# Patient Record
Sex: Female | Born: 1935 | Race: Black or African American | Hispanic: No | Marital: Married | Smoking: Never smoker
Health system: Southern US, Community
[De-identification: ages and names within clinical notes are randomized; demographics above are authoritative.]

## PROBLEM LIST (undated history)

## (undated) DIAGNOSIS — I509 Heart failure, unspecified: Secondary | ICD-10-CM

## (undated) DIAGNOSIS — E119 Type 2 diabetes mellitus without complications: Secondary | ICD-10-CM

## (undated) DIAGNOSIS — I10 Essential (primary) hypertension: Secondary | ICD-10-CM

---

## 2015-12-04 ENCOUNTER — Encounter (HOSPITAL_BASED_OUTPATIENT_CLINIC_OR_DEPARTMENT_OTHER): Payer: Self-pay | Admitting: *Deleted

## 2015-12-04 ENCOUNTER — Emergency Department (HOSPITAL_BASED_OUTPATIENT_CLINIC_OR_DEPARTMENT_OTHER): Payer: Medicare HMO

## 2015-12-04 ENCOUNTER — Emergency Department (HOSPITAL_BASED_OUTPATIENT_CLINIC_OR_DEPARTMENT_OTHER)
Admission: EM | Admit: 2015-12-04 | Discharge: 2015-12-04 | Disposition: A | Discharge status: Home / Self Care | Payer: Medicare HMO | Attending: Emergency Medicine | Admitting: Emergency Medicine

## 2015-12-04 DIAGNOSIS — E119 Type 2 diabetes mellitus without complications: Secondary | ICD-10-CM | POA: Insufficient documentation

## 2015-12-04 DIAGNOSIS — I10 Essential (primary) hypertension: Secondary | ICD-10-CM | POA: Insufficient documentation

## 2015-12-04 DIAGNOSIS — N939 Abnormal uterine and vaginal bleeding, unspecified: Secondary | ICD-10-CM | POA: Diagnosis present

## 2015-12-04 DIAGNOSIS — Z794 Long term (current) use of insulin: Secondary | ICD-10-CM | POA: Insufficient documentation

## 2015-12-04 DIAGNOSIS — N814 Uterovaginal prolapse, unspecified: Secondary | ICD-10-CM | POA: Diagnosis not present

## 2015-12-04 DIAGNOSIS — I5020 Unspecified systolic (congestive) heart failure: Secondary | ICD-10-CM | POA: Insufficient documentation

## 2015-12-04 DIAGNOSIS — Z79899 Other long term (current) drug therapy: Secondary | ICD-10-CM | POA: Insufficient documentation

## 2015-12-04 HISTORY — DX: Type 2 diabetes mellitus without complications: E11.9

## 2015-12-04 HISTORY — DX: Heart failure, unspecified: I50.9

## 2015-12-04 HISTORY — DX: Essential (primary) hypertension: I10

## 2015-12-04 LAB — CBC WITH DIFFERENTIAL/PLATELET
BASOS ABS: 0 10*3/uL (ref 0.0–0.1)
BASOS PCT: 0 %
EOS ABS: 0.2 10*3/uL (ref 0.0–0.7)
EOS PCT: 2 %
HCT: 39.1 % (ref 36.0–46.0)
Hemoglobin: 13 g/dL (ref 12.0–15.0)
LYMPHS PCT: 20 %
Lymphs Abs: 1.7 10*3/uL (ref 0.7–4.0)
MCH: 30.6 pg (ref 26.0–34.0)
MCHC: 33.2 g/dL (ref 30.0–36.0)
MCV: 92 fL (ref 78.0–100.0)
MONO ABS: 0.5 10*3/uL (ref 0.1–1.0)
Monocytes Relative: 6 %
Neutro Abs: 6.3 10*3/uL (ref 1.7–7.7)
Neutrophils Relative %: 72 %
PLATELETS: 191 10*3/uL (ref 150–400)
RBC: 4.25 MIL/uL (ref 3.87–5.11)
RDW: 13.3 % (ref 11.5–15.5)
WBC: 8.8 10*3/uL (ref 4.0–10.5)

## 2015-12-04 LAB — BASIC METABOLIC PANEL
ANION GAP: 8 (ref 5–15)
BUN: 24 mg/dL — ABNORMAL HIGH (ref 6–20)
CALCIUM: 8.9 mg/dL (ref 8.9–10.3)
CO2: 24 mmol/L (ref 22–32)
Chloride: 107 mmol/L (ref 101–111)
Creatinine, Ser: 1.22 mg/dL — ABNORMAL HIGH (ref 0.44–1.00)
GFR, EST AFRICAN AMERICAN: 48 mL/min — AB (ref >60)
GFR, EST NON AFRICAN AMERICAN: 41 mL/min — AB (ref >60)
Glucose, Bld: 244 mg/dL — ABNORMAL HIGH (ref 65–99)
POTASSIUM: 4.4 mmol/L (ref 3.5–5.1)
SODIUM: 139 mmol/L (ref 135–145)

## 2015-12-04 LAB — TROPONIN I

## 2015-12-04 LAB — BRAIN NATRIURETIC PEPTIDE: B NATRIURETIC PEPTIDE 5: 312.9 pg/mL — AB (ref 0.0–100.0)

## 2015-12-04 MED ORDER — FUROSEMIDE 10 MG/ML IJ SOLN
40.0000 mg | Freq: Once | INTRAMUSCULAR | Status: AC
  Administered 2015-12-04: 40 mg via INTRAVENOUS
  Filled 2015-12-04: qty 4

## 2015-12-04 NOTE — ED Notes (Signed)
Pt reports vaginal spotting "for a while"- states heavy bleeding started today. O2 sats 85-88 % during triage. Up to 94% with 2L O2

## 2015-12-04 NOTE — ED Notes (Signed)
Pt transported to US at this time. 

## 2015-12-04 NOTE — ED Notes (Signed)
Pt placed on auto vitals Q30.  

## 2015-12-04 NOTE — ED Provider Notes (Signed)
CSN: [id number]     Arrival date & time 12/04/15  1647 History  By signing my name below, I, Bethel BornBritney McCollum, attest that this documentation has been prepared under the direction and in the presence of Rolan BuccoMelanie Josephyne Tarter, MD. Electronically Signed: Bethel BornBritney McCollum, ED Scribe. 12/04/2015. 9:56 PM   Chief Complaint  Patient presents with  . Vaginal Bleeding   The history is provided by the patient. No language interpreter was used.   Jamie BrookingJosephine Welchel is a 81 y.o. female with history of HTN, DM, and CHF who presents to the Emergency Department complaining of vaginal bleeding with onset 2 months ago. Pt states that she has had intermittent vaginal spotting for 2 months but today the bleeding increased. The blood is bright red.  Pt denies abdominal pain, nausea, vomiting, dysuria, or difficulty urinating. Pt is not on home oxygen. She occasionally feels SOB but did not have any difficulty breathing today. Denies chest pain, cough, and congestion. She has been taking all of her medication with no recent missed doses. [facility].    Past Medical History  Diagnosis Date  . Diabetes mellitus without complication (HCC)   . Hypertension   . CHF (congestive heart failure) (HCC)    History reviewed. No pertinent past surgical history. No family history on file. Social History  Substance Use Topics  . Smoking status: Never Smoker   . Smokeless tobacco: Never Used  . Alcohol Use: No   OB History    No data available     Review of Systems  Constitutional: Negative for fever, chills, diaphoresis and fatigue.  HENT: Negative for congestion, rhinorrhea and sneezing.   Eyes: Negative.   Respiratory: Negative for cough, chest tightness and shortness of breath.   Cardiovascular: Negative for chest pain and leg swelling.  Gastrointestinal: Negative for nausea, vomiting, abdominal pain, diarrhea and blood in stool.  Genitourinary: Positive for vaginal  bleeding. Negative for dysuria, frequency, hematuria, flank pain and difficulty urinating.  Musculoskeletal: Negative for back pain and arthralgias.  Skin: Negative for rash.  Neurological: Negative for dizziness, speech difficulty, weakness, numbness and headaches.    Allergies  Review of patient's allergies indicates no known allergies.  Home Medications   Prior to Admission medications   Medication Sig Start Date End Date Taking? Authorizing Provider  amLODipine (NORVASC) 5 MG tablet Take 5 mg by mouth daily.   Yes Historical Provider, MD  carvedilol (COREG) 25 MG tablet Take 25 mg by mouth 2 (two) times daily with a meal.   Yes Historical Provider, MD  insulin detemir (LEVEMIR) 100 UNIT/ML injection Inject into the skin.   Yes Historical Provider, MD  isosorbide mononitrate (IMDUR) 60 MG 24 hr tablet Take 60 mg by mouth daily.   Yes Historical Provider, MD  losartan (COZAAR) 100 MG tablet Take 100 mg by mouth daily.   Yes Historical Provider, MD  rosuvastatin (CRESTOR) 40 MG tablet Take 40 mg by mouth daily.   Yes Historical Provider, MD  sitaGLIPtin (JANUVIA) 100 MG tablet Take 100 mg by mouth daily.   Yes Historical Provider, MD   BP 134/74 mmHg  Pulse 80  Temp(Src) 98.2 F (36.8 C)  Resp 20  Ht 5\' 3"  (1.6 m)  Wt 174 lb (78.926 kg)  BMI 30.83 kg/m2  SpO2 92% Physical Exam  Constitutional: She is oriented to person, place, and time. She appears well-developed and well-nourished. No distress.  Pt is on oxygen at 2L per  minute  HENT:  Head: Normocephalic and atraumatic.  Eyes: EOM are normal. Pupils are equal, round, and reactive to light.  Neck: Normal range of motion. Neck supple.  Cardiovascular: Normal rate, regular rhythm and normal heart sounds.   Pulmonary/Chest: Effort normal and breath sounds normal. No respiratory distress. She has no wheezes. She has no rales. She exhibits no tenderness.  Diminished breath sounds bilaterally.   Abdominal: Soft. Bowel sounds are  normal. She exhibits no distension. There is no tenderness. There is no rebound and no guarding.  Genitourinary:  Patient has uterine prolapse which is reducible. There is some spotty blood on the uterus. No tenderness or masses palpated.  Musculoskeletal: Normal range of motion. She exhibits no edema.  Lymphadenopathy:    She has no cervical adenopathy.  Neurological: She is alert and oriented to person, place, and time.  Skin: Skin is warm and dry. No rash noted.  Psychiatric: She has a normal mood and affect. Judgment normal.  Nursing note and vitals reviewed.   ED Course  Procedures (including critical care time) DIAGNOSTIC STUDIES: Oxygen Saturation is 95% on 2L, adequate normal by my interpretation.    COORDINATION OF CARE: 5:37 PM Discussed treatment plan which includes lab work and CXR with pt at bedside and pt agreed to plan.  Labs Review Labs Reviewed  BASIC METABOLIC PANEL - Abnormal; Notable for the following:    Glucose, Bld 244 (*)    BUN 24 (*)    Creatinine, Ser 1.22 (*)    GFR calc non Af Amer 41 (*)    GFR calc Af Amer 48 (*)    All other components within normal limits  BRAIN NATRIURETIC PEPTIDE - Abnormal; Notable for the following:    B Natriuretic Peptide 312.9 (*)    All other components within normal limits  CBC WITH DIFFERENTIAL/PLATELET  TROPONIN I    Imaging Review Dg Chest 2 View  12/04/2015  CLINICAL DATA:  Hypoxia.  Dyspnea.  Congestive heart failure. EXAM: CHEST  2 VIEW COMPARISON:  10/02/2008 FINDINGS: Heart size is within normal limits. Diffuse interstitial infiltrates are seen, suspicious for interstitial edema. This shows improvement compared to bilateral airspace disease seen on previous study. No evidence of pulmonary consolidation or pleural effusion. IMPRESSION: Diffuse interstitial infiltrates, suspicious for interstitial edema. Decreased bilateral airspace disease compared to prior study. Electronically Signed   By: Myles Rosenthal M.D.   On:  12/04/2015 18:24   US Transvaginal Non-ob  12/04/2015  CLINICAL DATA:  Postmenopausal bleeding. EXAM: TRANSABDOMINAL AND TRANSVAGINAL ULTRASOUND OF PELVIS TECHNIQUE: Both transabdominal and transvaginal ultrasound examinations of the pelvis were performed. Transabdominal technique was performed for global imaging of the pelvis including uterus, ovaries, adnexal regions, and pelvic cul-de-sac. It was necessary to proceed with endovaginal exam following the transabdominal exam to visualize the uterus, ovaries, and adnexa. COMPARISON:  None FINDINGS: Uterus Measurements: 7.1 x 3.5 x 5.1 cm. Diffusely heterogeneous uterine echotexture. Suspicion of small uterine fibroids, including a right-sided fundal lesion of 1.0 cm, and a left-sided fundal lesion of 1.2 cm. Endometrium Difficult to discretely identify given the underlying heterogeneous uterine echotexture. Measures on the order of 9 mm on image 50. Echogenicity with vascularity within the more anterior uterine body on image 45. Right ovary Measurements: 2.4 x 1.7 x 1.5 cm. Normal appearance/no adnexal mass. Left ovary Not visualized.  No adnexal mass seen. Other findings Trace pelvic fluid is nonspecific. IMPRESSION: 1. Abnormal appearance of the uterus, with diffuse heterogeneous uterine echotexture. Thickened endometrium, difficult  to discretely identify given underlying uterine heterogeneity. In the setting of post-menopausal bleeding, endometrial sampling is indicated to exclude carcinoma. If results are benign, sonohysterogram should be considered for focal lesion work-up. (Ref: Radiological Reasoning: Algorithmic Workup of Abnormal Vaginal Bleeding with Endovaginal Sonography and Sonohysterography. AJR 2008; 161:W96-04) Concurrent small volume uterine fibroids suspected. 2. Lack of visualization of the left ovary. 3. Trace nonspecific pelvic fluid. Electronically Signed   By: Jeronimo Greaves M.D.   On: 12/04/2015 20:10   US Pelvis Complete  12/04/2015   CLINICAL DATA:  Postmenopausal bleeding. EXAM: TRANSABDOMINAL AND TRANSVAGINAL ULTRASOUND OF PELVIS TECHNIQUE: Both transabdominal and transvaginal ultrasound examinations of the pelvis were performed. Transabdominal technique was performed for global imaging of the pelvis including uterus, ovaries, adnexal regions, and pelvic cul-de-sac. It was necessary to proceed with endovaginal exam following the transabdominal exam to visualize the uterus, ovaries, and adnexa. COMPARISON:  None FINDINGS: Uterus Measurements: 7.1 x 3.5 x 5.1 cm. Diffusely heterogeneous uterine echotexture. Suspicion of small uterine fibroids, including a right-sided fundal lesion of 1.0 cm, and a left-sided fundal lesion of 1.2 cm. Endometrium Difficult to discretely identify given the underlying heterogeneous uterine echotexture. Measures on the order of 9 mm on image 50. Echogenicity with vascularity within the more anterior uterine body on image 45. Right ovary Measurements: 2.4 x 1.7 x 1.5 cm. Normal appearance/no adnexal mass. Left ovary Not visualized.  No adnexal mass seen. Other findings Trace pelvic fluid is nonspecific. IMPRESSION: 1. Abnormal appearance of the uterus, with diffuse heterogeneous uterine echotexture. Thickened endometrium, difficult to discretely identify given underlying uterine heterogeneity. In the setting of post-menopausal bleeding, endometrial sampling is indicated to exclude carcinoma. If results are benign, sonohysterogram should be considered for focal lesion work-up. (Ref: Radiological Reasoning: Algorithmic Workup of Abnormal Vaginal Bleeding with Endovaginal Sonography and Sonohysterography. AJR 2008; 540:J81-19) Concurrent small volume uterine fibroids suspected. 2. Lack of visualization of the left ovary. 3. Trace nonspecific pelvic fluid. Electronically Signed   By: Jeronimo Greaves M.D.   On: 12/04/2015 20:10   I have personally reviewed and evaluated these images and lab results as part of my medical  decision-making.   EKG Interpretation None      MDM   Final diagnoses:  Uterine prolapse    Patient presents with vaginal bleeding. Pelvic exam shows uterine prolapse with some small spotty patches of blood. There is no active bleeding. She has no tenderness. Ultrasound shows some thickened endometrium but no masses were noted. In addition, she was noted to be hypoxic in triage. She is placed on a nasal cannula. Chest x-ray shows some mild pulmonary edema. Her BNP is mildly elevated. Her troponin is negative. She is asymptomatic without any chest pain or shortness of breath. She was given a dose of Lasix in the ED. She is now having normal oxygen saturations without supplemental oxygen. Her sats stayed above 93. She was discharged home in good condition. She was advised that she needs to be seen by a gynecologist. She is going to follow-up with her primary care physician at [facility] for referral to gynecology.  I advised her that she also needs follow-up with her cardiologist who is also at [facility]. She currently is not on any diuretics. Return precautions were given.  I personally performed the services described in this documentation, which was scribed in my presence.  The recorded information has been reviewed and considered.    Rolan Bucco, MD 12/04/15 2258

## 2015-12-04 NOTE — Discharge Instructions (Signed)
Heart Failure °Heart failure is a condition in which the heart has trouble pumping blood. This means your heart does not pump blood efficiently for your body to work well. In some cases of heart failure, fluid may back up into your lungs or you may have swelling (edema) in your lower legs. Heart failure is usually a long-term (chronic) condition. It is important for you to take good care of yourself and follow your health care provider's treatment plan. °CAUSES  °Some health conditions can cause heart failure. Those health conditions include: °· High blood pressure (hypertension). Hypertension causes the heart muscle to work harder than normal. When pressure in the blood vessels is high, the heart needs to pump (contract) with more force in order to circulate blood throughout the body. High blood pressure eventually causes the heart to become stiff and weak. °· Coronary artery disease (CAD). CAD is the buildup of cholesterol and fat (plaque) in the arteries of the heart. The blockage in the arteries deprives the heart muscle of oxygen and blood. This can cause chest pain and may lead to a heart attack. High blood pressure can also contribute to CAD. °· Heart attack (myocardial infarction). A heart attack occurs when one or more arteries in the heart become blocked. The loss of oxygen damages the muscle tissue of the heart. When this happens, part of the heart muscle dies. The injured tissue does not contract as well and weakens the heart's ability to pump blood. °· Abnormal heart valves. When the heart valves do not open and close properly, it can cause heart failure. This makes the heart muscle pump harder to keep the blood flowing. °· Heart muscle disease (cardiomyopathy or myocarditis). Heart muscle disease is damage to the heart muscle from a variety of causes. These can include drug or alcohol abuse, infections, or unknown reasons. These can increase the risk of heart failure. °· Lung disease. Lung disease  makes the heart work harder because the lungs do not work properly. This can cause a strain on the heart, leading it to fail. °· Diabetes. Diabetes increases the risk of heart failure. High blood sugar contributes to high fat (lipid) levels in the blood. Diabetes can also cause slow damage to tiny blood vessels that carry important nutrients to the heart muscle. When the heart does not get enough oxygen and food, it can cause the heart to become weak and stiff. This leads to a heart that does not contract efficiently. °· Other conditions can contribute to heart failure. These include abnormal heart rhythms, thyroid problems, and low blood counts (anemia). °Certain unhealthy behaviors can increase the risk of heart failure, including: °· Being overweight. °· Smoking or chewing tobacco. °· Eating foods high in fat and cholesterol. °· Abusing illicit drugs or alcohol. °· Lacking physical activity. °SYMPTOMS  °Heart failure symptoms may vary and can be hard to detect. Symptoms may include: °· Shortness of breath with activity, such as climbing stairs. °· Persistent cough. °· Swelling of the feet, ankles, legs, or abdomen. °· Unexplained weight gain. °· Difficulty breathing when lying flat (orthopnea). °· Waking from sleep because of the need to sit up and get more air. °· Rapid heartbeat. °· Fatigue and loss of energy. °· Feeling light-headed, dizzy, or close to fainting. °· Loss of appetite. °· Nausea. °· Increased urination during the night (nocturia). °DIAGNOSIS  °A diagnosis of heart failure is based on your history, symptoms, physical examination, and diagnostic tests. Diagnostic tests for heart failure may include: °·   Echocardiography. °· Electrocardiography. °· Chest X-ray. °· Blood tests. °· Exercise stress test. °· Cardiac angiography. °· Radionuclide scans. °TREATMENT  °Treatment is aimed at managing the symptoms of heart failure. Medicines, behavioral changes, or surgical intervention may be necessary to  treat heart failure. °· Medicines to help treat heart failure may include: °· Angiotensin-converting enzyme (ACE) inhibitors. This type of medicine blocks the effects of a blood protein called angiotensin-converting enzyme. ACE inhibitors relax (dilate) the blood vessels and help lower blood pressure. °· Angiotensin receptor blockers (ARBs). This type of medicine blocks the actions of a blood protein called angiotensin. Angiotensin receptor blockers dilate the blood vessels and help lower blood pressure. °· Water pills (diuretics). Diuretics cause the kidneys to remove salt and water from the blood. The extra fluid is removed through urination. This loss of extra fluid lowers the volume of blood the heart pumps. °· Beta blockers. These prevent the heart from beating too fast and improve heart muscle strength. °· Digitalis. This increases the force of the heartbeat. °· Healthy behavior changes include: °· Obtaining and maintaining a healthy weight. °· Stopping smoking or chewing tobacco. °· Eating heart-healthy foods. °· Limiting or avoiding alcohol. °· Stopping illicit drug use. °· Physical activity as directed by your health care provider. °· Surgical treatment for heart failure may include: °· A procedure to open blocked arteries, repair damaged heart valves, or remove damaged heart muscle tissue. °· A pacemaker to improve heart muscle function and control certain abnormal heart rhythms. °· An internal cardioverter defibrillator to treat certain serious abnormal heart rhythms. °· A left ventricular assist device (LVAD) to assist the pumping ability of the heart. °HOME CARE INSTRUCTIONS  °· Take medicines only as directed by your health care provider. Medicines are important in reducing the workload of your heart, slowing the progression of heart failure, and improving your symptoms. °· Do not stop taking your medicine unless directed by your health care provider. °· Do not skip any dose of medicine. °· Refill your  prescriptions before you run out of medicine. Your medicines are needed every day. °· Engage in moderate physical activity if directed by your health care provider. Moderate physical activity can benefit some people. The elderly and people with severe heart failure should consult with a health care provider for physical activity recommendations. °· Eat heart-healthy foods. Food choices should be free of trans fat and low in saturated fat, cholesterol, and salt (sodium). Healthy choices include fresh or frozen fruits and vegetables, fish, lean meats, legumes, fat-free or low-fat dairy products, and whole grain or high fiber foods. Talk to a dietitian to learn more about heart-healthy foods. °· Limit sodium if directed by your health care provider. Sodium restriction may reduce symptoms of heart failure in some people. Talk to a dietitian to learn more about heart-healthy seasonings. °· Use healthy cooking methods. Healthy cooking methods include roasting, grilling, broiling, baking, poaching, steaming, or stir-frying. Talk to a dietitian to learn more about healthy cooking methods. °· Limit fluids if directed by your health care provider. Fluid restriction may reduce symptoms of heart failure in some people. °· Weigh yourself every day. Daily weights are important in the early recognition of excess fluid. You should weigh yourself every morning after you urinate and before you eat breakfast. Wear the same amount of clothing each time you weigh yourself. Record your daily weight. Provide your health care provider with your weight record. °· Monitor and record your blood pressure if directed by your health care   provider.  Check your pulse if directed by your health care provider.  Lose weight if directed by your health care provider. Weight loss may reduce symptoms of heart failure in some people.  Stop smoking or chewing tobacco. Nicotine makes your heart work harder by causing your blood vessels to constrict.  Do not use nicotine gum or patches before talking to your health care provider.  Keep all follow-up visits as directed by your health care provider. This is important.  Limit alcohol intake to no more than 1 drink per day for nonpregnant women and 2 drinks per day for men. One drink equals 12 ounces of beer, 5 ounces of wine, or 1 ounces of hard liquor. Drinking more than that is harmful to your heart. Tell your health care provider if you drink alcohol several times a week. Talk with your health care provider about whether alcohol is safe for you. If your heart has already been damaged by alcohol or you have severe heart failure, drinking alcohol should be stopped completely.  Stop illicit drug use.  Stay up-to-date with immunizations. It is especially important to prevent respiratory infections through current pneumococcal and influenza immunizations.  Manage other health conditions such as hypertension, diabetes, thyroid disease, or abnormal heart rhythms as directed by your health care provider.  Learn to manage stress.  Plan rest periods when fatigued.  Learn strategies to manage high temperatures. If the weather is extremely hot:  Avoid vigorous physical activity.  Use air conditioning or fans or seek a cooler location.  Avoid caffeine and alcohol.  Wear loose-fitting, lightweight, and light-colored clothing.  Learn strategies to manage cold temperatures. If the weather is extremely cold:  Avoid vigorous physical activity.  Layer clothes.  Wear mittens or gloves, a hat, and a scarf when going outside.  Avoid alcohol.  Obtain ongoing education and support as needed.  Participate in or seek rehabilitation as needed to maintain or improve independence and quality of life. SEEK MEDICAL CARE IF:   You have a rapid weight gain.  You have increasing shortness of breath that is unusual for you.  You are unable to participate in your usual physical activities.  You tire  easily.  You cough more than normal, especially with physical activity.  You have any or more swelling in areas such as your hands, feet, ankles, or abdomen.  You are unable to sleep because it is hard to breathe.  You feel like your heart is beating fast (palpitations).  You become dizzy or light-headed upon standing up. SEEK IMMEDIATE MEDICAL CARE IF:   You have difficulty breathing.  There is a change in mental status such as decreased alertness or difficulty with concentration.  You have a pain or discomfort in your chest.  You have an episode of fainting (syncope). MAKE SURE YOU:   Understand these instructions.  Will watch your condition.  Will get help right away if you are not doing well or get worse.   This information is not intended to replace advice given to you by your health care provider. Make sure you discuss any questions you have with your health care provider.   Document Released: 09/18/2005 Document Revised: 02/02/2015 Document Reviewed: 10/18/2012 Elsevier Interactive Patient Education 2016 [employer]  Pelvic Organ Prolapse Pelvic organ prolapse is the stretching, bulging, or dropping of pelvic organs into an abnormal position. It happens when the muscles and tissues that surround and support pelvic structures are stretched or weak. Pelvic organ prolapse can involve:  Vagina (vaginal prolapse).  Uterus (uterine prolapse).  Bladder (cystocele).  Rectum (rectocele).  Intestines (enterocele). When organs other than the vagina are involved, they often bulge into the vagina or protrude from the vagina, depending on how severe the prolapse is. CAUSES Causes of this condition include:  Pregnancy, labor, and childbirth.  Long-lasting (chronic) cough.  Chronic constipation.  Obesity.  Past pelvic surgery.  Aging. During and after menopause, a decreased production of the hormone estrogen can weaken pelvic ligaments and muscles.  Consistently  lifting more than 50 lb (23 kg).  Buildup of fluid in the abdomen due to certain diseases and other conditions. SYMPTOMS Symptoms of this condition include:  Loss of bladder control when you cough, sneeze, strain, and exercise (stress incontinence). This may be worse immediately following childbirth, and it may gradually improve over time.  Feeling pressure in your pelvis or vagina. This pressure may increase when you cough or when you are having a bowel movement.  A bulge that protrudes from the opening of your vagina or against your vaginal wall. If your uterus protrudes through the opening of your vagina and rubs against your clothing, you may also experience soreness, ulcers, infection, pain, and bleeding.  Increased effort to have a bowel movement or urinate.  Pain in your low back.  Pain, discomfort, or disinterest in sexual intercourse.  Repeated bladder infections (urinary tract infections).  Difficulty inserting or inability to insert a tampon or applicator. In some people, this condition does not cause any symptoms. DIAGNOSIS Your health care provider may perform an internal and external vaginal and rectal exam. During the exam, you may be asked to cough and strain while you are lying down, sitting, and standing up. Your health care provider will determine if other tests are required, such as bladder function tests. TREATMENT In most cases, this condition needs to be treated only if it produces symptoms. No treatment is guaranteed to correct the prolapse or relieve the symptoms completely. Treatment may include:  Lifestyle changes, such as:  Avoiding drinking beverages that contain caffeine.  Increasing your intake of high-fiber foods. This can help to decrease constipation and straining during bowel movements.  Emptying your bladder at scheduled times (bladder training therapy). This can help to reduce or avoid urinary incontinence.  Losing weight if you are overweight  or obese.  Estrogen. Estrogen may help mild prolapse by increasing the strength and tone of pelvic floor muscles.  Kegel exercises. These may help mild cases of prolapse by strengthening and tightening the muscles of the pelvic floor.  Pessary insertion. A pessary is a soft, flexible device that is placed into your vagina by your health care provider to help support the vaginal walls and keep pelvic organs in place.  Surgery. This is often the only form of treatment for severe prolapse. Different types of surgeries are available. HOME CARE INSTRUCTIONS  Wear a sanitary pad or absorbent product if you have urinary incontinence.  Avoid heavy lifting and straining with exercise and work. Do not hold your breath when you perform mild to moderate lifting and exercise activities. Limit your activities as directed by your health care provider.  Take medicines only as directed by your health care provider.  Perform Kegel exercises as directed by your health care provider.  If you have a pessary, take care of it as directed by your health care provider. SEEK MEDICAL CARE IF:  Your symptoms interfere with your daily activities or sex life.  You need medicine to help  with the discomfort.  You notice bleeding from the vagina that is not related to your period.  You have a fever.  You have pain or bleeding when you urinate.  You have bleeding when you have a bowel movement.  You lose urine when you have sex.  You have chronic constipation.  You have a pessary that falls out.  You have vaginal discharge that has a bad smell.  You have low abdominal pain or cramping that is unusual for you.   This information is not intended to replace advice given to you by your health care provider. Make sure you discuss any questions you have with your health care provider.   Document Released: 04/15/2014 Document Reviewed: 04/15/2014 Elsevier Interactive Patient Education [employer]2016 [employer]

## 2015-12-04 NOTE — ED Notes (Signed)
Pt with sats 95% with 2L of oxygen. Pt doesn't normally wear oxygen at home.

## 2015-12-04 NOTE — ED Notes (Signed)
Patient transported to Ultrasound 

## 2016-08-29 IMAGING — US US TRANSVAGINAL NON-OB
1 series · 13 of 25 positions shown · non-contrast
Comparison: None

CLINICAL DATA: Postmenopausal bleeding.

EXAM:
TRANSABDOMINAL AND TRANSVAGINAL ULTRASOUND OF PELVIS
TECHNIQUE: Both transabdominal and transvaginal ultrasound examinations of the
pelvis were performed. Transabdominal technique was performed for
global imaging of the pelvis including uterus, ovaries, adnexal
regions, and pelvic cul-de-sac. It was necessary to proceed with
endovaginal exam following the transabdominal exam to visualize the
uterus, ovaries, and adnexa.

[Series 1: us transvaginal non-ob · 0.31mm/px · 13 of 93 slices shown]
[im 1/93]
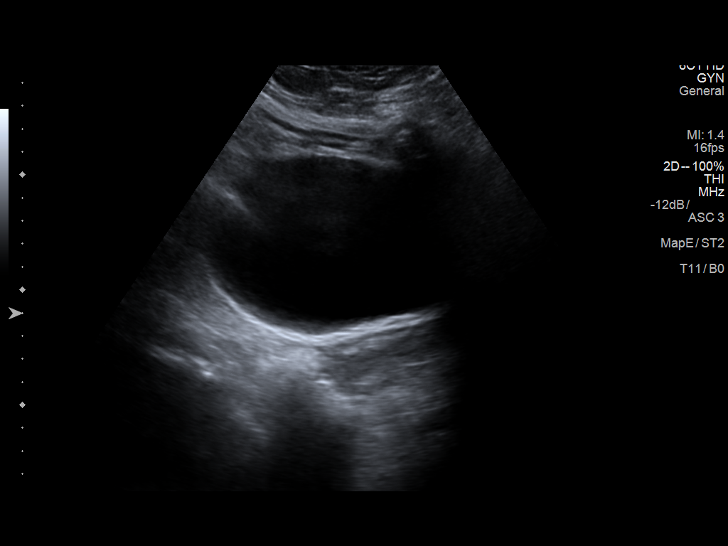
[im 8/93]
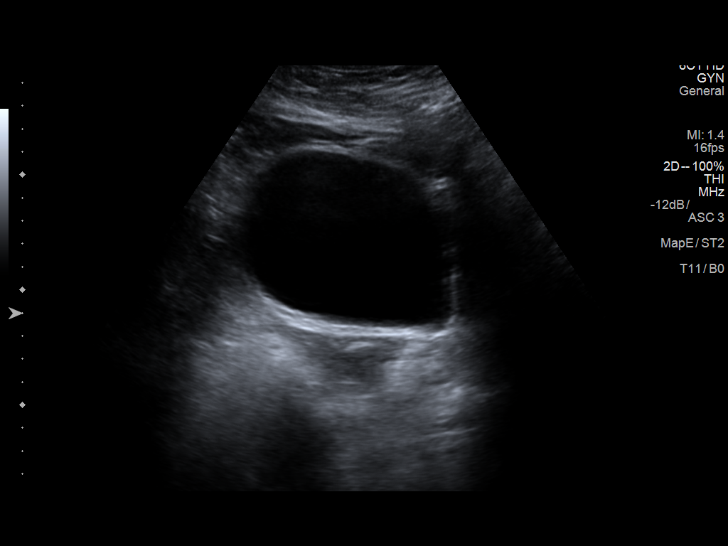
[im 16/93]
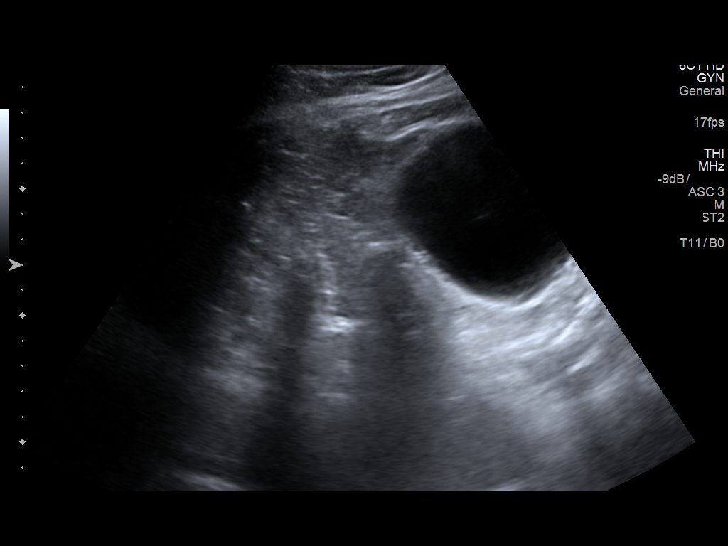
[im 24/93]
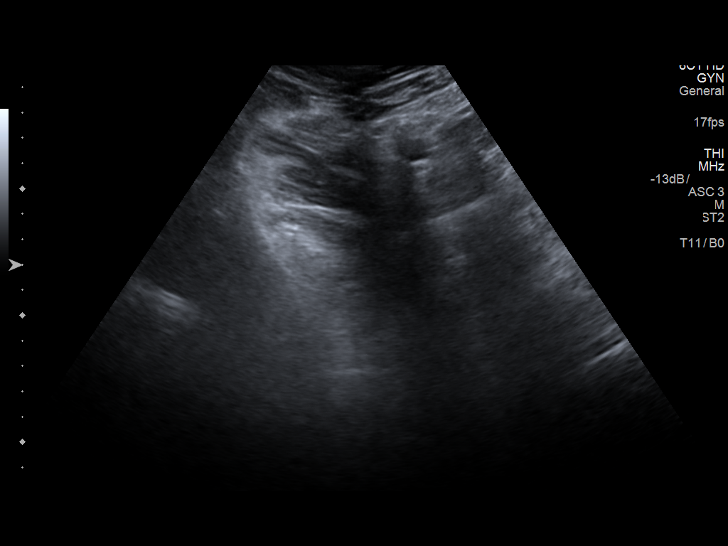
[im 31/93]
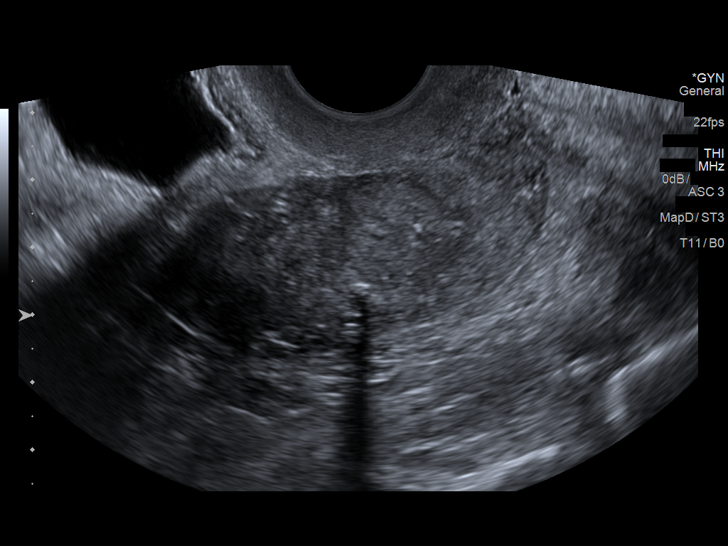
[im 39/93]
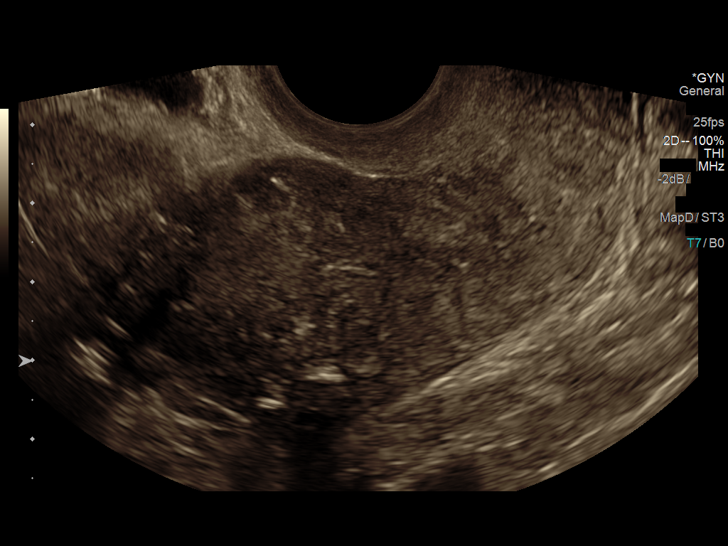
[im 47/93]
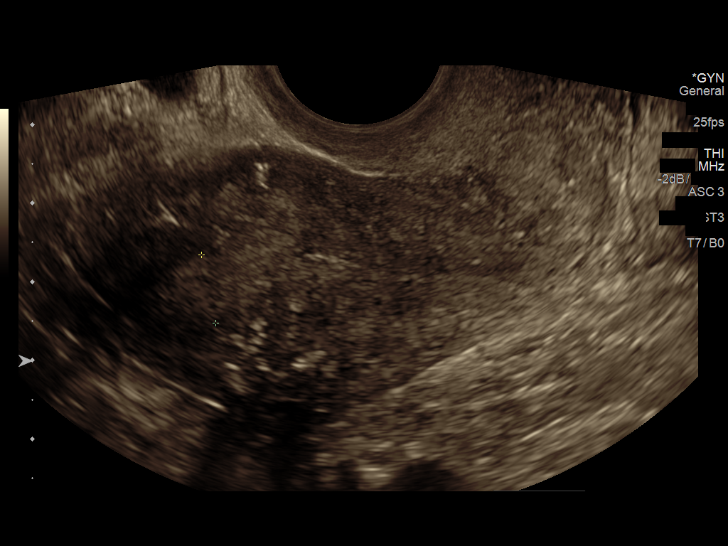
[im 54/93]
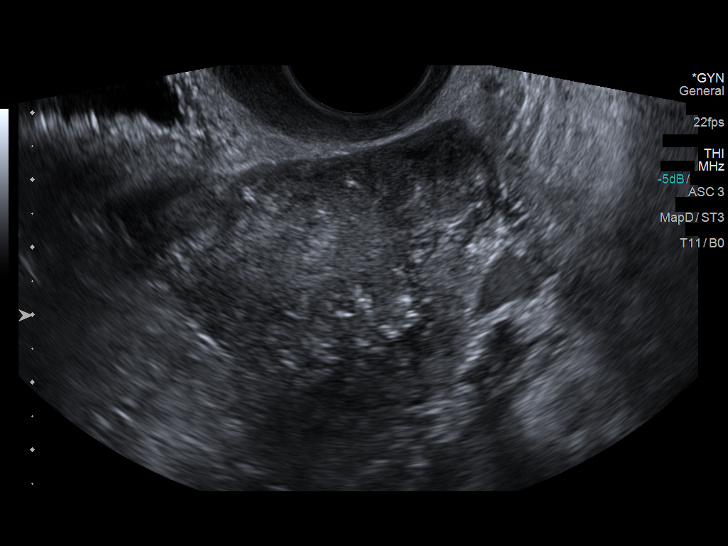
[im 62/93]
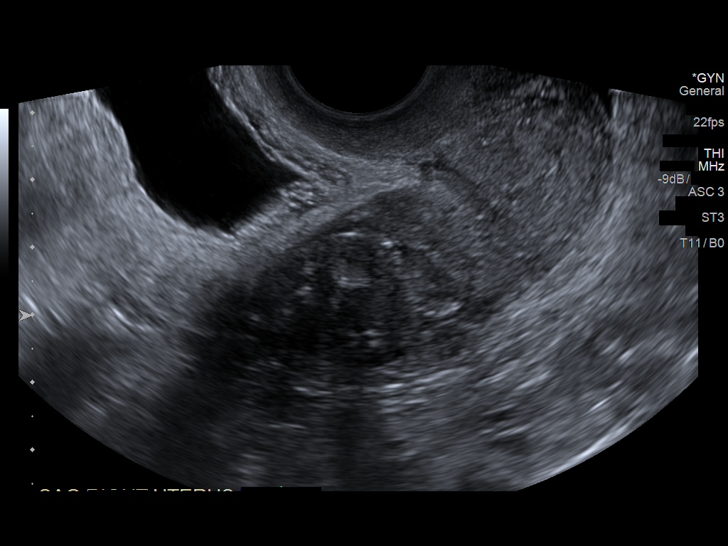
[im 70/93]
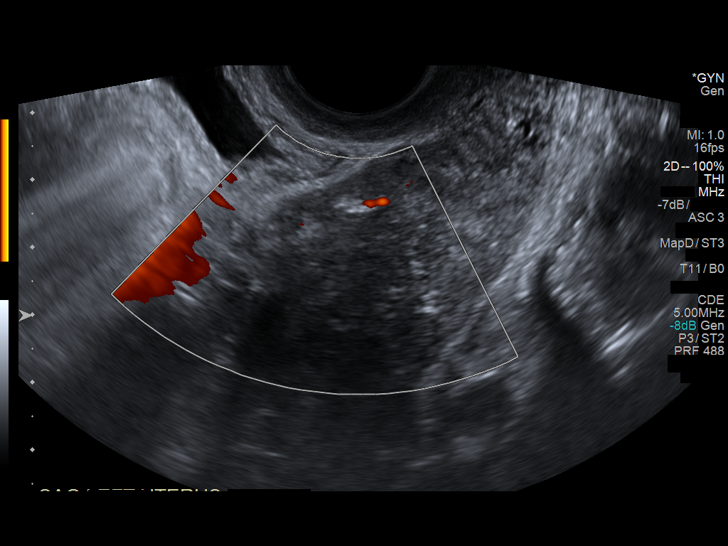
[im 77/93]
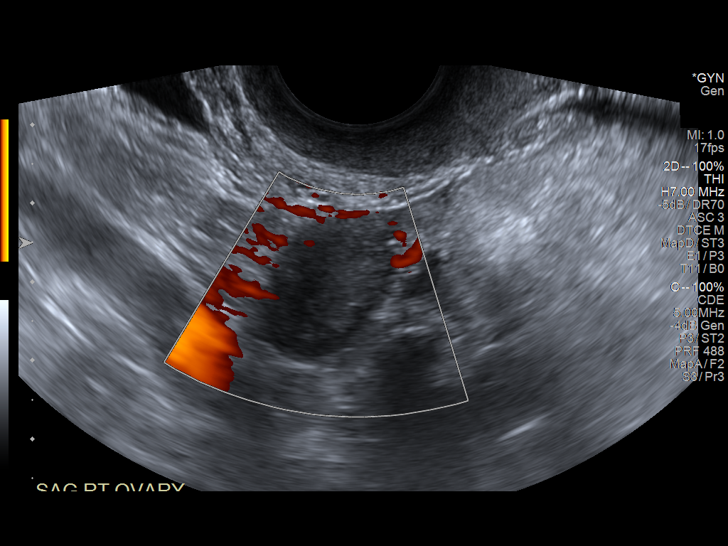
[im 85/93]
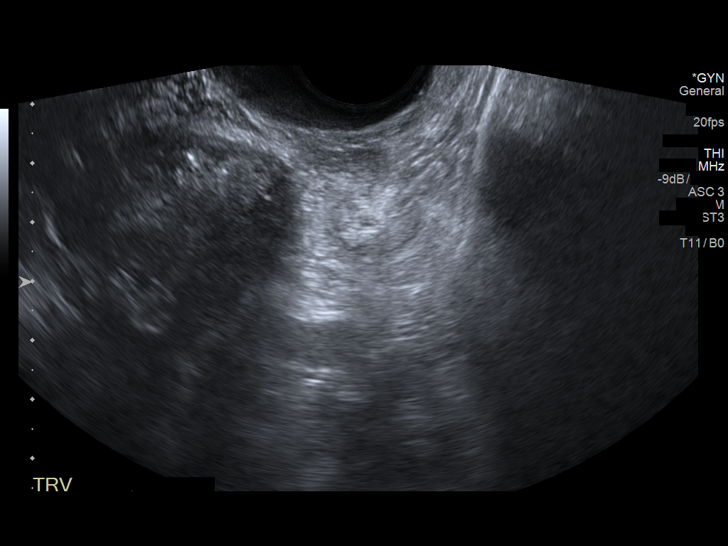
[im 93/93]
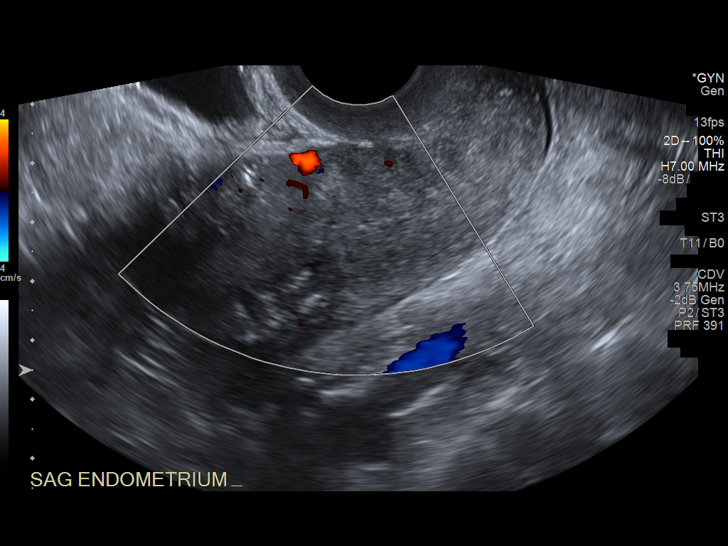

[13 of 25 positions shown; findings below may reference images not displayed]

FINDINGS: Uterus

Measurements: 7.1 x 3.5 x 5.1 cm.. Diffusely heterogeneous uterine
echotexture. Suspicion of small uterine fibroids, including a
right-sided fundal lesion of 1.0 cm, and a left-sided fundal lesion
of 1.2 cm.

Endometrium

Difficult to discretely identify given the underlying heterogeneous
uterine echotexture. Measures on the order of 9 mm on image 50.
Echogenicity with vascularity within the more anterior uterine body
on image 45.

Right ovary

Measurements: 2.4 x 1.7 x 1.5 cm. Normal appearance/no adnexal mass.

Left ovary

Not visualized.  No adnexal mass seen.

Other findings

Trace pelvic fluid is nonspecific.
IMPRESSION: 1. Abnormal appearance of the uterus, with diffuse heterogeneous
uterine echotexture. Thickened endometrium, difficult to discretely
identify given underlying uterine heterogeneity. In the setting of
post-menopausal bleeding, endometrial sampling is indicated to
exclude carcinoma. If results are benign, sonohysterogram should be
considered for focal lesion work-up. (Ref: Radiological Reasoning:
Algorithmic Workup of Abnormal Vaginal Bleeding with Endovaginal
Sonography and Sonohysterography. AJR [id number]; [id number]:[id number]) Concurrent
small volume uterine fibroids suspected.
2. Lack of visualization of the left ovary.
3. Trace nonspecific pelvic fluid.

## 2017-08-01 IMAGING — DX DG CHEST 2V
2 series · 2 of 2 positions shown · non-contrast
Comparison: 10/02/2008

CLINICAL DATA: Hypoxia.  Dyspnea.  Congestive heart failure.

EXAM:
CHEST  2 VIEW

[chest pa]
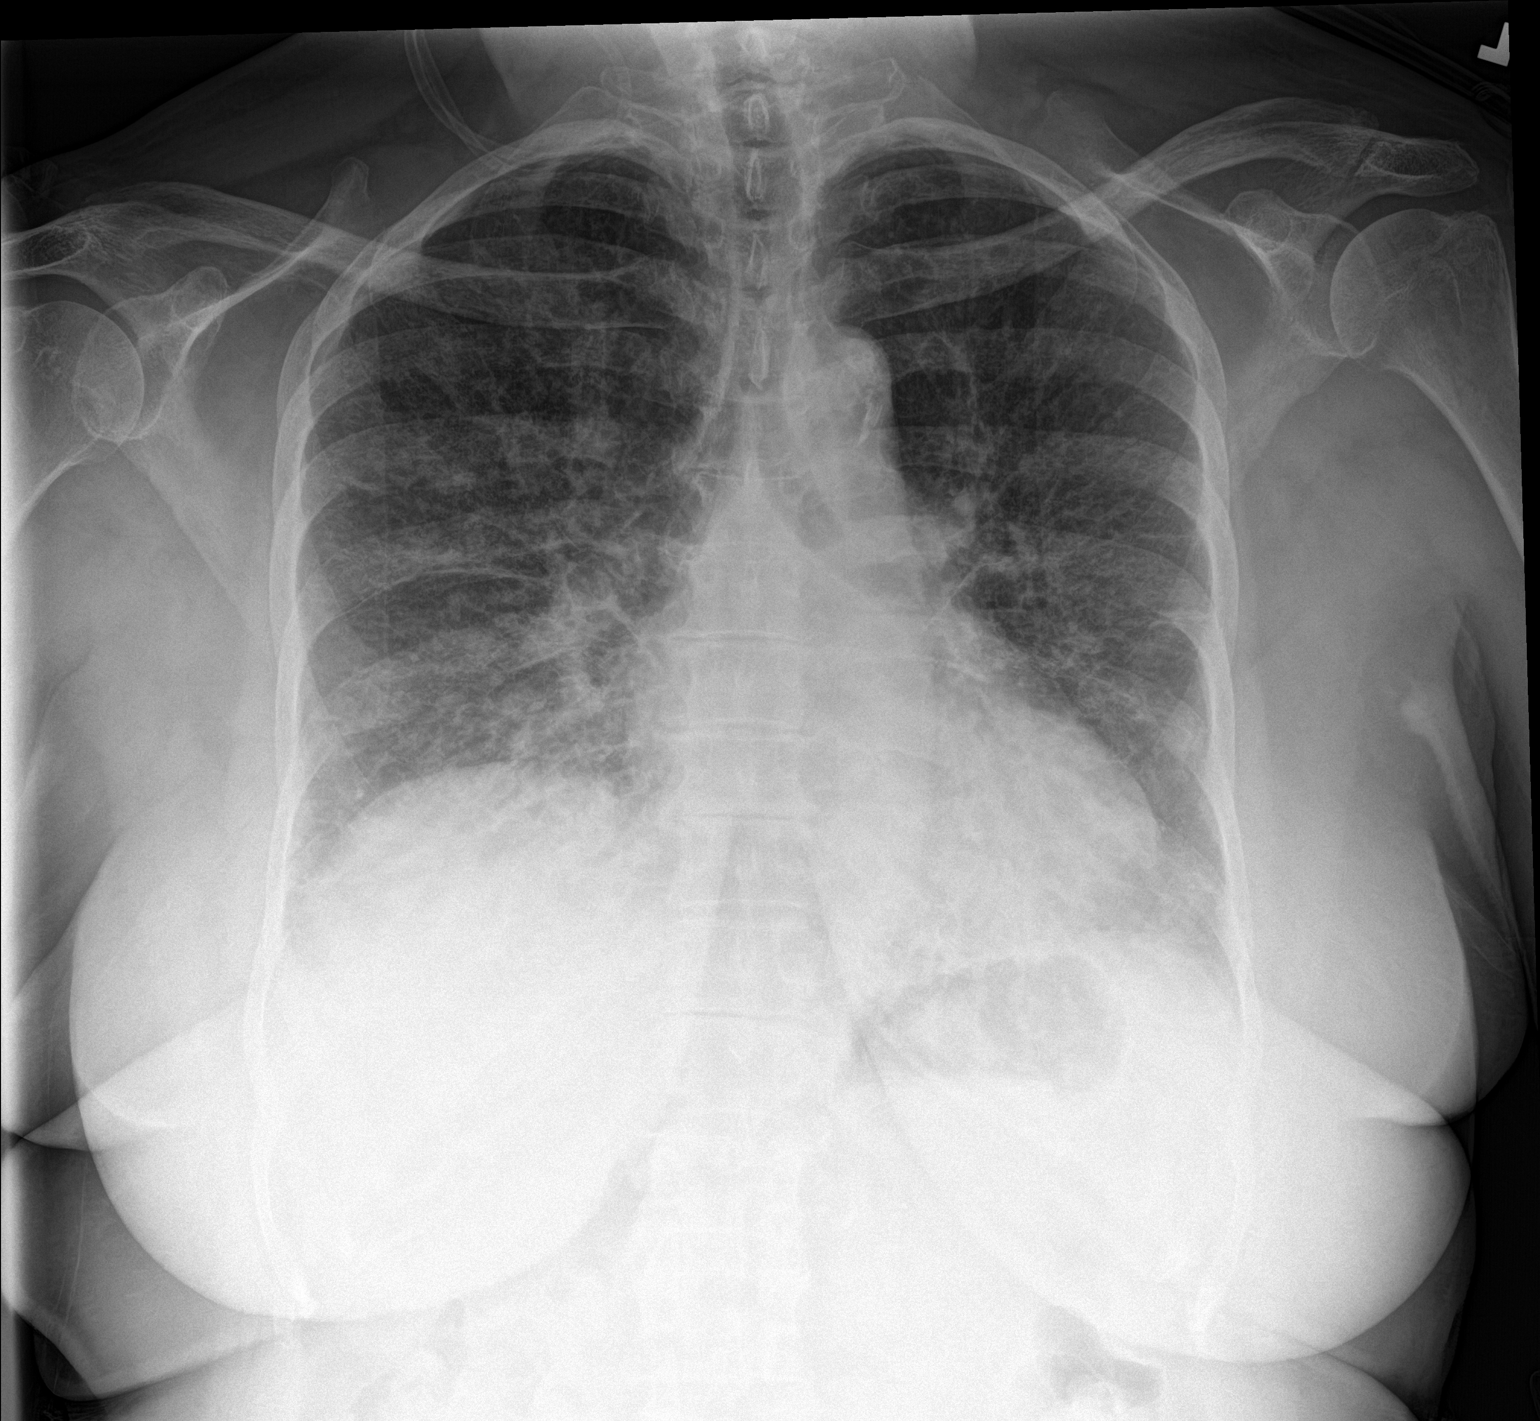

[chest lat]
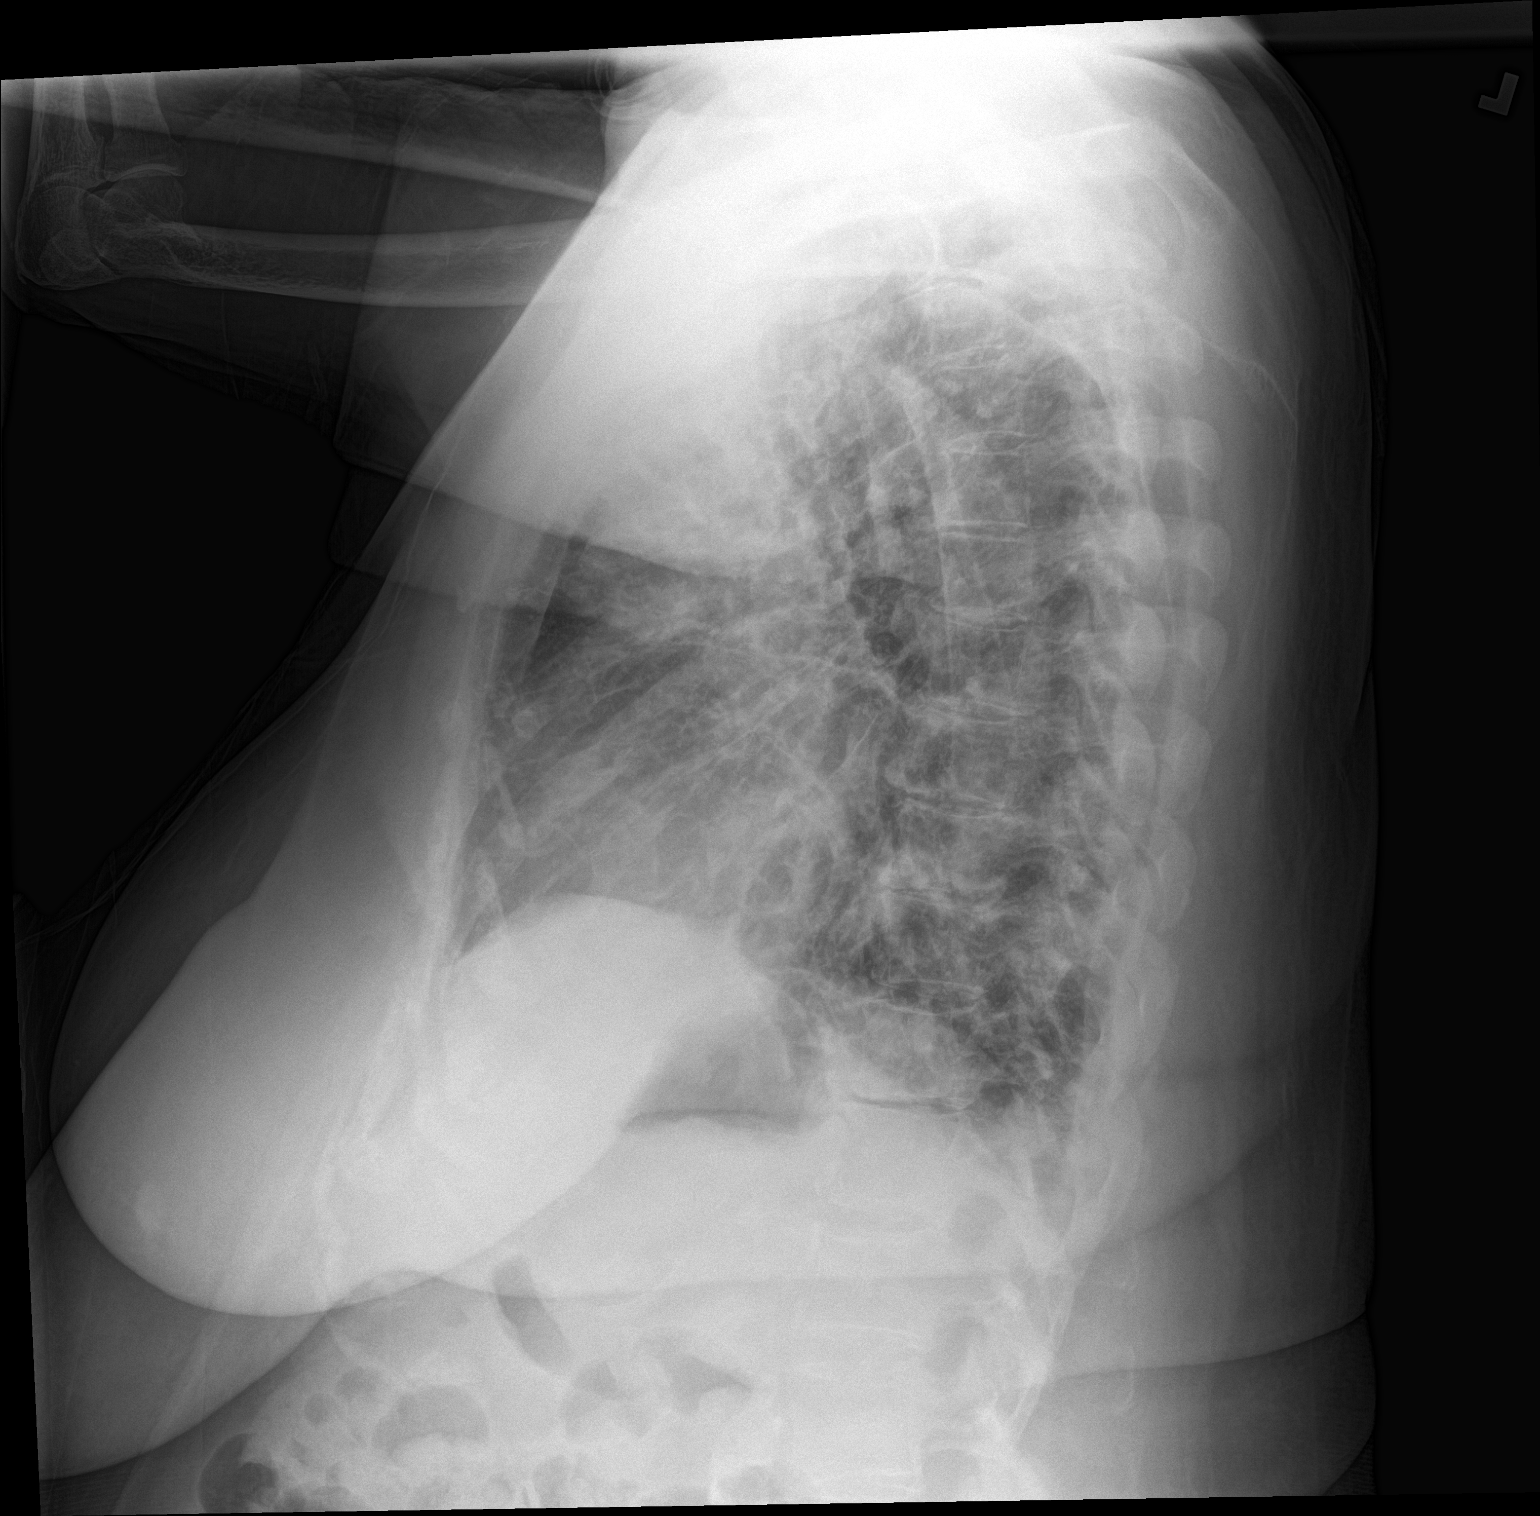

[2 of 2 positions shown; findings below may reference images not displayed]

FINDINGS: Heart size is within normal limits. Diffuse interstitial infiltrates
are seen, suspicious for interstitial edema. This shows improvement
compared to bilateral airspace disease seen on previous study. No
evidence of pulmonary consolidation or pleural effusion.
IMPRESSION: Diffuse interstitial infiltrates, suspicious for interstitial edema.
Decreased bilateral airspace disease compared to prior study.
